# Patient Record
Sex: Male | Born: 1995 | Race: White | Hispanic: No | Marital: Single | Smoking: Current every day smoker
Health system: Southern US, Community
[De-identification: ages and names within clinical notes are randomized; demographics above are authoritative.]

## PROBLEM LIST (undated history)

## (undated) DIAGNOSIS — J45909 Unspecified asthma, uncomplicated: Secondary | ICD-10-CM

---
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
## 2017-08-14 ENCOUNTER — Emergency Department (HOSPITAL_BASED_OUTPATIENT_CLINIC_OR_DEPARTMENT_OTHER)
Admission: EM | Admit: 2017-08-14 | Discharge: 2017-08-15 | Disposition: A | Discharge status: Home / Self Care | Payer: Self-pay | Attending: Emergency Medicine | Admitting: Emergency Medicine

## 2017-08-14 ENCOUNTER — Encounter (HOSPITAL_BASED_OUTPATIENT_CLINIC_OR_DEPARTMENT_OTHER): Payer: Self-pay | Admitting: *Deleted

## 2017-08-14 ENCOUNTER — Other Ambulatory Visit: Payer: Self-pay

## 2017-08-14 DIAGNOSIS — J45909 Unspecified asthma, uncomplicated: Secondary | ICD-10-CM | POA: Insufficient documentation

## 2017-08-14 DIAGNOSIS — L309 Dermatitis, unspecified: Secondary | ICD-10-CM

## 2017-08-14 DIAGNOSIS — L259 Unspecified contact dermatitis, unspecified cause: Secondary | ICD-10-CM | POA: Insufficient documentation

## 2017-08-14 DIAGNOSIS — F17210 Nicotine dependence, cigarettes, uncomplicated: Secondary | ICD-10-CM | POA: Insufficient documentation

## 2017-08-14 MED ORDER — TRIAMCINOLONE ACETONIDE 0.1 % EX CREA: 1.0000 "application " | TOPICAL_CREAM | Freq: Two times a day (BID) | CUTANEOUS | 0 refills | Status: AC

## 2017-08-14 NOTE — Discharge Instructions (Signed)
Please read attached information regarding your condition. Apply triamcinolone cream to affected areas. Follow-up with dermatologist listed below for further evaluation. Return to ED for worsening symptoms, lip swelling or trouble breathing, fever, neck pain.

## 2017-08-14 NOTE — ED Triage Notes (Signed)
Bilateral leg rash x several months-reports itching and 'dryness'

## 2017-08-14 NOTE — ED Provider Notes (Signed)
[facility] EMERGENCY DEPARTMENT Provider Note   CSN: [id number] Arrival date & time: 08/14/17  1713     History   Chief Complaint Chief Complaint  Patient presents with  . Rash    HPI Jon Oneal is a 22 y.o. male with a past medical history of asthma, who presents to ED for evaluation of rash to legs, wrists, hands for the past 4 months.  He reports itching and "dryness." He reports he has a family history of eczema but no personal history. He is unsure if this was caused by his new detergent at home. He did try changing his bodywash a few months after the rash began. He states that since onset, the rash has improved. He has not tried any OTC medications to help with symptoms. Denies any fever, neck pain, bodyaches, tick bites, vomiting, abdominal pain, previous history of similar symptoms in the past, outdoor exposures, sick contacts with similar symptoms.  HPI  Past Medical History:  Diagnosis Date  . Asthma     There are no active problems to display for this patient.   History reviewed. No pertinent surgical history.     Home Medications    Prior to Admission medications   Medication Sig Start Date End Date Taking? Authorizing Provider  triamcinolone cream (KENALOG) 0.1 % Apply 1 application topically 2 (two) times daily. 08/14/17   Dietrich PatesKhatri, Nakota Elsen, PA-C    Family History History reviewed. No pertinent family history.  Social History Social History   Tobacco Use  . Smoking status: Current Every Day Smoker    Types: Cigarettes  . Smokeless tobacco: Former NeurosurgeonUser    Types: Chew  Substance Use Topics  . Alcohol use: Yes    Comment: weekly  . Drug use: No     Allergies   Patient has no known allergies.   Review of Systems Review of Systems  Constitutional: Negative for appetite change, chills and fever.  HENT: Negative for ear pain, rhinorrhea, sneezing and sore throat.   Respiratory: Negative for cough, chest tightness, shortness of breath  and wheezing.   Cardiovascular: Negative for chest pain and palpitations.  Gastrointestinal: Negative for abdominal pain, diarrhea, nausea and vomiting.  Musculoskeletal: Negative for myalgias and neck pain.  Skin: Positive for rash.     Physical Exam Updated Vital Signs BP (!) 154/97 (BP Location: Right Arm)   Pulse 94   Temp 98.5 F (36.9 C) (Oral)   Resp 18   Ht 6\' 3"  (1.905 m)   Wt 111.1 kg (245 lb)   SpO2 99%   BMI 30.62 kg/m   Physical Exam  Constitutional: He appears well-developed and well-nourished. No distress.  Nontoxic appearing and in no acute distress.  No signs of angioedema or anaphylaxis.  HENT:  Head: Normocephalic and atraumatic.  Eyes: Conjunctivae and EOM are normal. No scleral icterus.  Neck: Normal range of motion.  No meningismus.  Pulmonary/Chest: Effort normal. No respiratory distress.  Neurological: He is alert.  Skin: Rash noted. He is not diaphoretic.  Diffuse, eczematous, rash noted on legs, knuckles of hand and wrist. Associated excoriation.   Psychiatric: He has a normal mood and affect.  Nursing note and vitals reviewed.    ED Treatments / Results  Labs (all labs ordered are listed, but only abnormal results are displayed) Labs Reviewed - No data to display  EKG  EKG Interpretation None       Radiology No results found.  Procedures Procedures (including critical care time)  Medications  Ordered in ED Medications - No data to display   Initial Impression / Assessment and Plan / ED Course  I have reviewed the triage vital signs and the nursing notes.  Pertinent labs & imaging results that were available during my care of the patient were reviewed by me and considered in my medical decision making (see chart for details).     Patient presents to ED for evaluation of rash present for the past 4 months.  Denies any other symptoms including fever, neck pain, vomiting, body aches, bug bites.  On physical exam he is overall  well-appearing.  He has no signs of anaphylaxis or angioedema.  There is a eczematous rash noted with associated excoriation that appears consistent with contact dermatitis or eczema.  It does not appear consistent with scabies.  We will treat him for eczema/contact dermatitis with triamcinolone cream and refer him to dermatology for further evaluation.  Pt has a patent airway without stridor and is handling secretions without difficulty; no angioedema. No blisters, no pustules, no warmth, no draining sinus tracts, no superficial abscesses, no bullous impetigo, no vesicles, no desquamation, no target lesions with dusky purpura or a central bulla. Not tender to touch. No concern for superimposed infection. No concern for SJS, TEN, TSS, tick borne illness, syphilis or other life-threatening condition.  Patient appears stable for discharge at this time.  Strict return precautions given.  Portions of this note were generated with Scientist, clinical (histocompatibility and immunogenetics). Dictation errors may occur despite best attempts at proofreading.   Final Clinical Impressions(s) / ED Diagnoses   Final diagnoses:  Contact dermatitis, unspecified contact dermatitis type, unspecified trigger  Eczema, unspecified type    ED Discharge Orders        Ordered    triamcinolone cream (KENALOG) 0.1 %  2 times daily     08/14/17 2308       Dietrich Pates, PA-C 08/14/17 2328    Derwood Kaplan, MD 08/15/17 [phone]

## 2017-08-15 NOTE — ED Notes (Signed)
Pt verbalizes understanding of d/c instructions and denies any further needs at this time. 

## 2019-03-13 ENCOUNTER — Emergency Department (HOSPITAL_BASED_OUTPATIENT_CLINIC_OR_DEPARTMENT_OTHER): Payer: Self-pay

## 2019-03-13 ENCOUNTER — Emergency Department (HOSPITAL_BASED_OUTPATIENT_CLINIC_OR_DEPARTMENT_OTHER)
Admission: EM | Admit: 2019-03-13 | Discharge: 2019-03-13 | Disposition: A | Discharge status: Home / Self Care | Payer: Self-pay | Attending: Emergency Medicine | Admitting: Emergency Medicine

## 2019-03-13 ENCOUNTER — Encounter (HOSPITAL_BASED_OUTPATIENT_CLINIC_OR_DEPARTMENT_OTHER): Payer: Self-pay | Admitting: *Deleted

## 2019-03-13 ENCOUNTER — Other Ambulatory Visit: Payer: Self-pay

## 2019-03-13 DIAGNOSIS — S8992XA Unspecified injury of left lower leg, initial encounter: Secondary | ICD-10-CM | POA: Diagnosis not present

## 2019-03-13 DIAGNOSIS — Y998 Other external cause status: Secondary | ICD-10-CM | POA: Diagnosis not present

## 2019-03-13 DIAGNOSIS — S8991XA Unspecified injury of right lower leg, initial encounter: Secondary | ICD-10-CM | POA: Diagnosis present

## 2019-03-13 DIAGNOSIS — F17210 Nicotine dependence, cigarettes, uncomplicated: Secondary | ICD-10-CM | POA: Diagnosis not present

## 2019-03-13 DIAGNOSIS — Y92410 Unspecified street and highway as the place of occurrence of the external cause: Secondary | ICD-10-CM | POA: Diagnosis not present

## 2019-03-13 DIAGNOSIS — M25561 Pain in right knee: Secondary | ICD-10-CM

## 2019-03-13 DIAGNOSIS — Y93I9 Activity, other involving external motion: Secondary | ICD-10-CM | POA: Insufficient documentation

## 2019-03-13 DIAGNOSIS — J45909 Unspecified asthma, uncomplicated: Secondary | ICD-10-CM | POA: Diagnosis not present

## 2019-03-13 MED ORDER — IBUPROFEN 800 MG PO TABS: 800.0000 mg | ORAL_TABLET | Freq: Three times a day (TID) | ORAL | 0 refills | Status: AC | PRN

## 2019-03-13 NOTE — ED Notes (Signed)
Pt reports goody powder approx 3 hr pta with mild relief. Pt reports difficulty bending legs

## 2019-03-13 NOTE — Discharge Instructions (Signed)
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
## 2021-11-09 ENCOUNTER — Encounter (HOSPITAL_BASED_OUTPATIENT_CLINIC_OR_DEPARTMENT_OTHER): Payer: Self-pay | Admitting: Emergency Medicine

## 2021-11-09 ENCOUNTER — Other Ambulatory Visit: Payer: Self-pay

## 2021-11-09 ENCOUNTER — Emergency Department (HOSPITAL_BASED_OUTPATIENT_CLINIC_OR_DEPARTMENT_OTHER): Payer: Self-pay

## 2021-11-09 ENCOUNTER — Emergency Department (HOSPITAL_BASED_OUTPATIENT_CLINIC_OR_DEPARTMENT_OTHER)
Admission: EM | Admit: 2021-11-09 | Discharge: 2021-11-09 | Disposition: A | Discharge status: Home / Self Care | Payer: Self-pay | Attending: Emergency Medicine | Admitting: Emergency Medicine

## 2021-11-09 DIAGNOSIS — S93401A Sprain of unspecified ligament of right ankle, initial encounter: Secondary | ICD-10-CM | POA: Insufficient documentation

## 2021-11-09 DIAGNOSIS — W06XXXA Fall from bed, initial encounter: Secondary | ICD-10-CM | POA: Insufficient documentation

## 2021-11-09 DIAGNOSIS — M25571 Pain in right ankle and joints of right foot: Secondary | ICD-10-CM | POA: Insufficient documentation

## 2021-11-09 NOTE — ED Triage Notes (Signed)
Pt arrives pov, slow limping gait c/o fall yesterday. Endorses leg fell asleep, and caused fall when patient tried to stand. Reports right foot and ankle pain, swelling noted.  ?

## 2021-11-09 NOTE — ED Provider Notes (Signed)
?[facility] EMERGENCY DEPARTMENT ?Provider Note ? ? ?CSN: [id number] ?Arrival date & time: 11/09/21  1217 ? ?  ? ?History ? ?Chief Complaint  ?Patient presents with  ? Fall  ? ? ?Jon Oneal is a 26 y.o. male. ? ?Patient reports he stepped out of bed yesterday and his foot was asleep.  Patient turned his foot and ankle.  He reports he limped around yesterday however this morning foot is more swollen and painful.  He denies any other area of injuries. ? ?The history is provided by the patient. No language interpreter was used.  ?Fall ?This is a new problem. The current episode started yesterday. The problem occurs constantly. The problem has been gradually worsening. Nothing aggravates the symptoms. Nothing relieves the symptoms. He has tried nothing for the symptoms. The treatment provided no relief.  ? ?  ? ?Home Medications ?Prior to Admission medications   ?Medication Sig Start Date End Date Taking? Authorizing Provider  ?ibuprofen (ADVIL) 800 MG tablet Take 1 tablet (800 mg total) by mouth every 8 (eight) hours as needed for moderate pain. 03/13/19   Long, Wonda Olds, MD  ?triamcinolone cream (KENALOG) 0.1 % Apply 1 application topically 2 (two) times daily. 08/14/17   Delia Heady, PA-C  ?   ? ?Allergies    ?Patient has no known allergies.   ? ?Review of Systems   ?Review of Systems  ?Musculoskeletal:  Positive for joint swelling.  ?All other systems reviewed and are negative. ? ?Physical Exam ?Updated Vital Signs ?BP 127/89 (BP Location: Right Arm)   Pulse 97   Temp 98.2 ?F (36.8 ?C) (Oral)   Resp 18   Ht 6\' 3"  (1.905 m)   Wt 131.5 kg   SpO2 97%   BMI 36.25 kg/m?  ?Physical Exam ?Vitals and nursing note reviewed.  ?Constitutional:   ?   General: He is not in acute distress. ?   Appearance: He is well-developed.  ?HENT:  ?   Head: Normocephalic and atraumatic.  ?   Mouth/Throat:  ?   Mouth: Mucous membranes are moist.  ?Cardiovascular:  ?   Rate and Rhythm: Normal rate.  ?   Heart sounds: No  murmur heard. ?Pulmonary:  ?   Effort: Pulmonary effort is normal.  ?Musculoskeletal:     ?   General: Swelling present.  ?Skin: ?   General: Skin is warm and dry.  ?   Capillary Refill: Capillary refill takes less than 2 seconds.  ?Neurological:  ?   Mental Status: He is alert.  ?Psychiatric:     ?   Mood and Affect: Mood normal.  ? ? ?ED Results / Procedures / Treatments   ?Labs ?(all labs ordered are listed, but only abnormal results are displayed) ?Labs Reviewed - No data to display ? ?EKG ?None ? ?Radiology ?DG Ankle Complete Right ? ?Result Date: 11/09/2021 ?CLINICAL DATA:  [place].  Right ankle pain. EXAM: RIGHT ANKLE - COMPLETE 3+ VIEW COMPARISON:  None Available. FINDINGS: The ankle mortise is maintained. No acute ankle fracture. No ankle joint effusion. The mid and hindfoot bony structures are intact. IMPRESSION: No acute bony findings. Electronically Signed   By: Marijo Sanes M.D.   On: 11/09/2021 13:36  ? ?DG Foot Complete Right ? ?Result Date: 11/09/2021 ?CLINICAL DATA:  [place] yesterday.  Pain and swelling. EXAM: RIGHT FOOT COMPLETE - 3+ VIEW COMPARISON:  None Available. FINDINGS: The joint spaces are maintained. No acute foot fracture is identified. IMPRESSION: No acute bony findings. Electronically  Signed   By: Marijo Sanes M.D.   On: 11/09/2021 13:34   ? ?Procedures ?Procedures  ? ? ?Medications Ordered in ED ?Medications - No data to display ? ?ED Course/ Medical Decision Making/ A&P ?  ?                        ?Medical Decision Making ?Amount and/or Complexity of Data Reviewed ?Radiology: ordered. ? ? ?MDM:  xray right foot and right ankle no fracture.  Pt placed in an aso and given cruthes.  I advised ibuprofen.  Elevate.  Follow up with Dr. Raeford Razor for recheck if symptoms persist  ? ? ? ? ? ? ? ?Final Clinical Impression(s) / ED Diagnoses ?Final diagnoses:  ?Moderate ankle sprain, right, initial encounter  ? ? ?Rx / DC Orders ?ED Discharge Orders   ? ? None  ? ?  ? ?An After Visit Summary was printed  and given to the patient. ? ?  ?Fransico Meadow, [place] ?11/09/21 1434 ? ?  ?Regan Lemming, MD ?11/09/21 1553 ? ?

## 2021-11-09 NOTE — ED Notes (Signed)
Pt does have very mild swelling to right ankle. ?

## 2021-11-09 NOTE — ED Notes (Signed)
Pt reports he was asleep and woke up with his right leg was asleep. Pt stated he jumped up and due to his leg being asleep he fell and rolled his right ankle under him. Pt stated it hurts to put weight on his right foot. ?

## 2021-11-09 NOTE — Discharge Instructions (Addendum)
ICe to area of swelling.  Elevate.  Follow up with Dr. Jordan Likes if symptoms persist.  ?

## 2022-08-09 IMAGING — DX DG ANKLE COMPLETE 3+V*R*
3 series · 3 of 3 positions shown · non-contrast
Comparison: None Available.

CLINICAL DATA: Fell.  Right ankle pain.

EXAM:
RIGHT ANKLE - COMPLETE 3+ VIEW

[ankle ap]
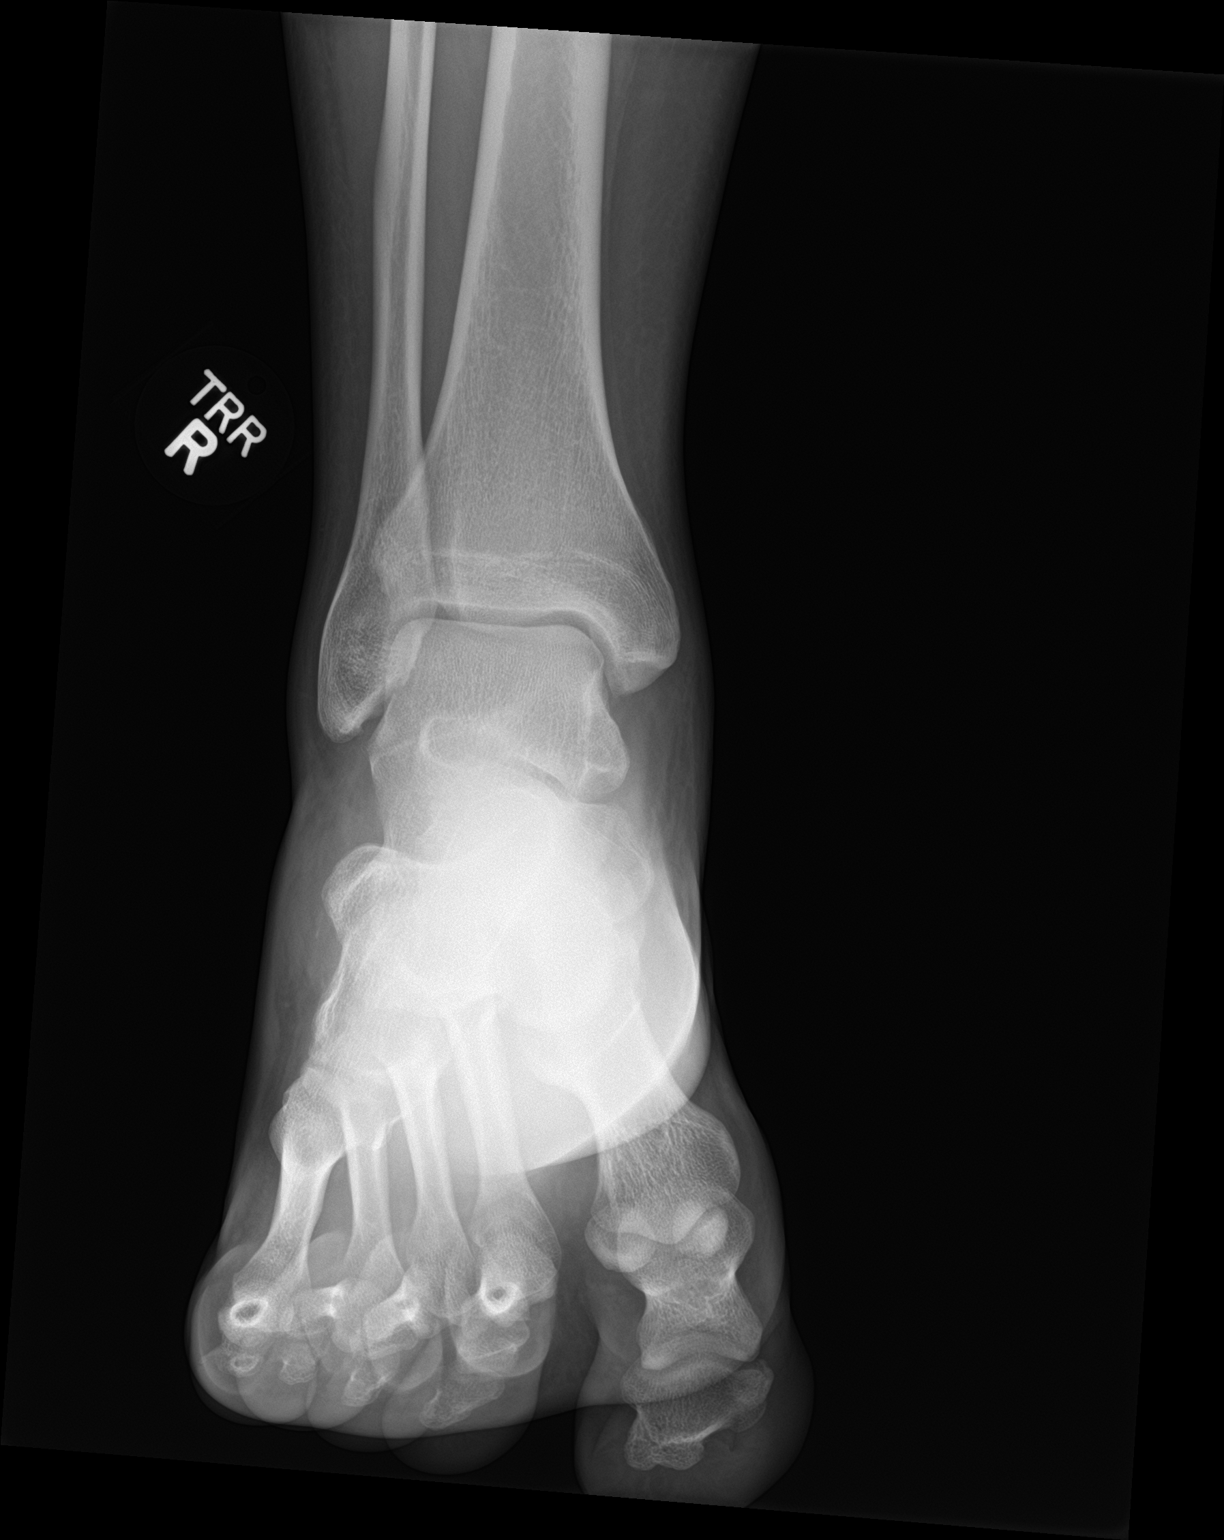

[ankle obl]
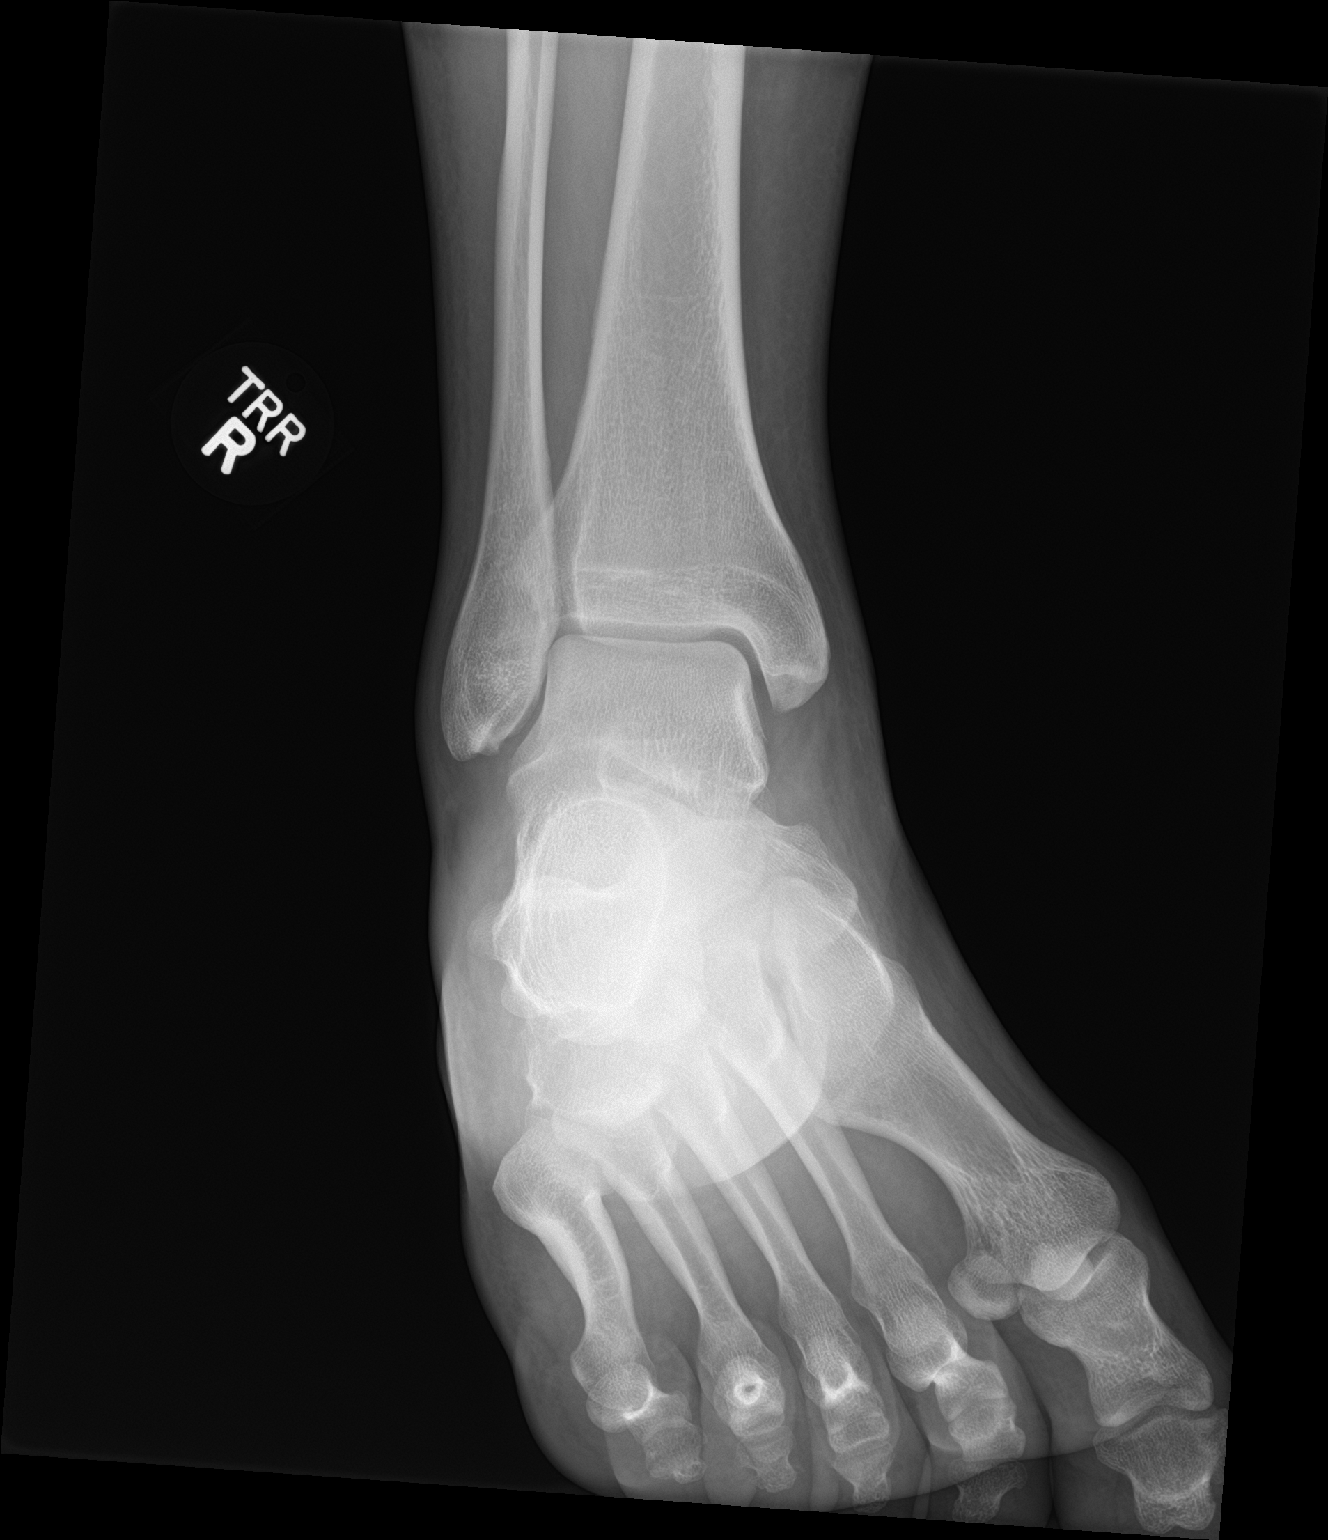

[ankle lat]
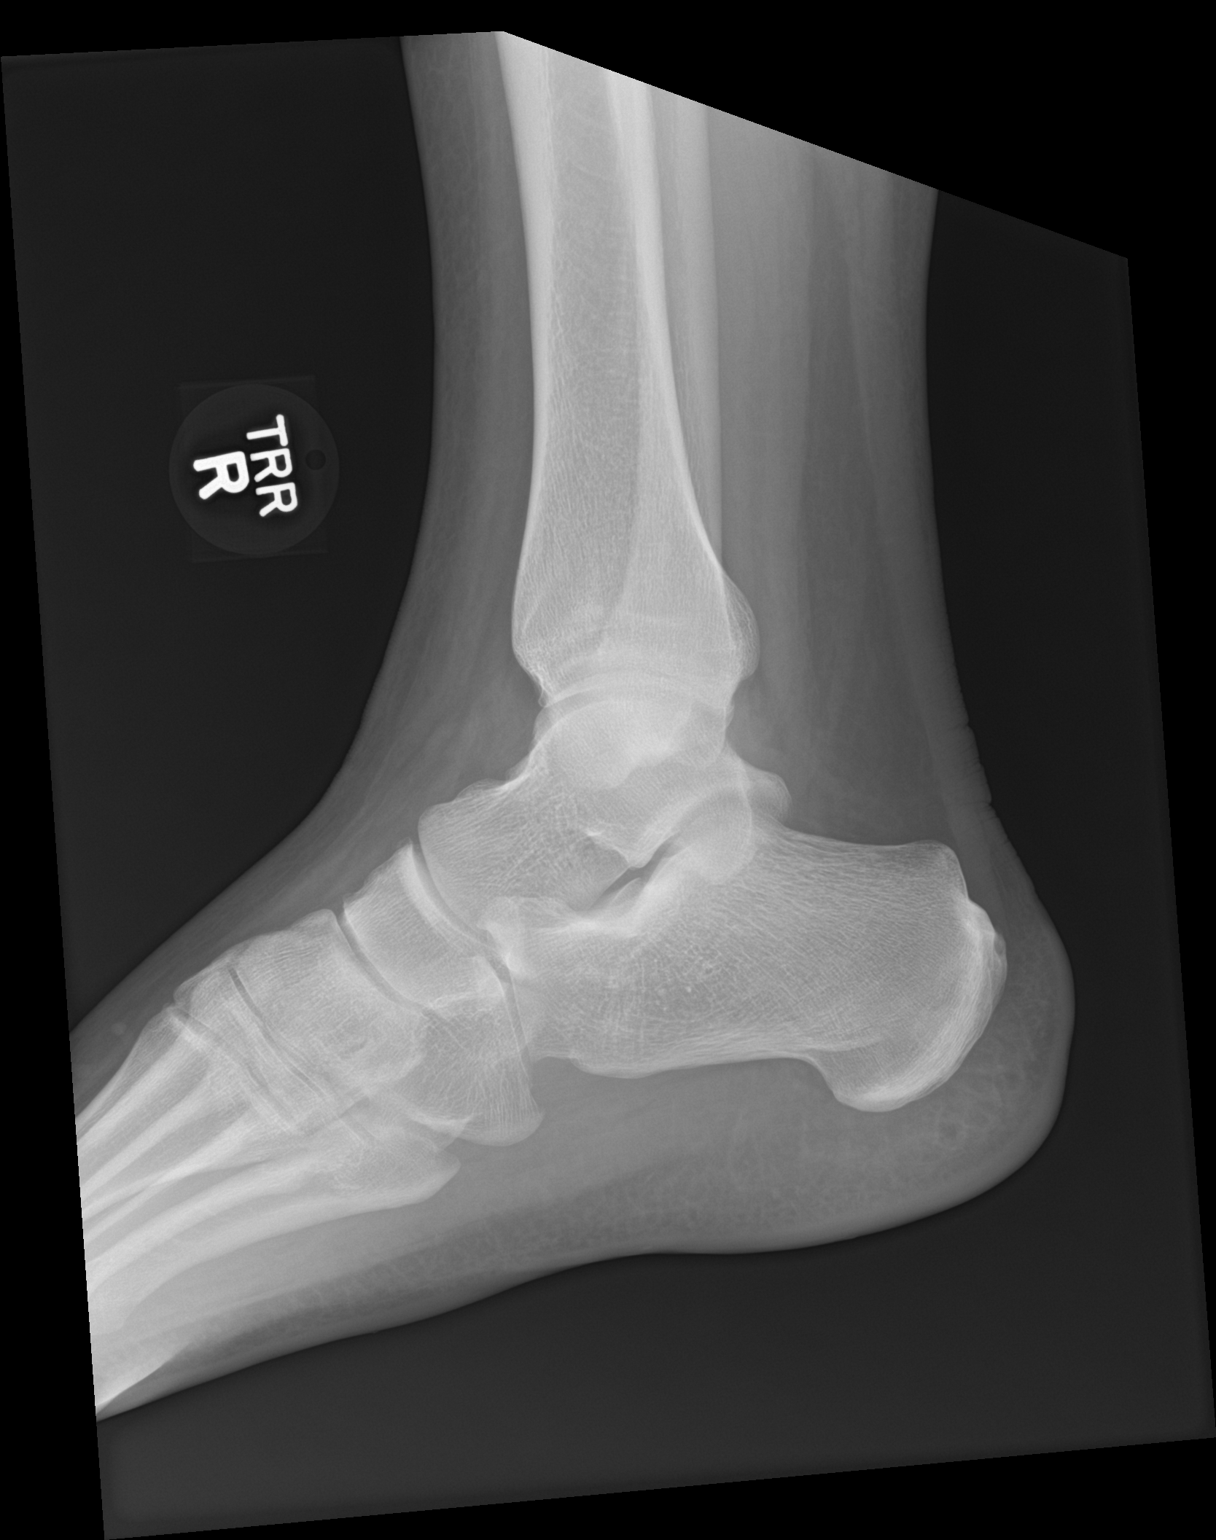

[3 of 3 positions shown; findings below may reference images not displayed]

FINDINGS: The ankle mortise is maintained. No acute ankle fracture. No ankle
joint effusion. The mid and hindfoot bony structures are intact.
IMPRESSION: No acute bony findings.
